# Patient Record
Sex: Male | Born: 1975 | Race: White | Hispanic: No | Marital: Single | Smoking: Never smoker
Health system: Southern US, Community
[De-identification: ages and names within clinical notes are randomized; demographics above are authoritative.]

## PROBLEM LIST (undated history)

## (undated) DIAGNOSIS — F32A Depression, unspecified: Secondary | ICD-10-CM

## (undated) DIAGNOSIS — J439 Emphysema, unspecified: Secondary | ICD-10-CM

## (undated) DIAGNOSIS — K219 Gastro-esophageal reflux disease without esophagitis: Secondary | ICD-10-CM

## (undated) DIAGNOSIS — I10 Essential (primary) hypertension: Secondary | ICD-10-CM

## (undated) DIAGNOSIS — J45909 Unspecified asthma, uncomplicated: Secondary | ICD-10-CM

## (undated) DIAGNOSIS — T7840XA Allergy, unspecified, initial encounter: Secondary | ICD-10-CM

## (undated) DIAGNOSIS — F419 Anxiety disorder, unspecified: Secondary | ICD-10-CM

## (undated) HISTORY — DX: Allergy, unspecified, initial encounter: T78.40XA

## (undated) HISTORY — DX: Anxiety disorder, unspecified: F41.9

## (undated) HISTORY — PX: NO PAST SURGERIES: SHX2092

## (undated) HISTORY — DX: Emphysema, unspecified: J43.9

## (undated) HISTORY — DX: Unspecified asthma, uncomplicated: J45.909

## (undated) HISTORY — DX: Depression, unspecified: F32.A

## (undated) HISTORY — DX: Gastro-esophageal reflux disease without esophagitis: K21.9

## (undated) HISTORY — DX: Essential (primary) hypertension: I10

---

## 2008-05-25 ENCOUNTER — Emergency Department: Payer: Self-pay | Admitting: Emergency Medicine

## 2008-06-12 ENCOUNTER — Emergency Department: Payer: Self-pay | Admitting: Emergency Medicine

## 2008-08-03 ENCOUNTER — Emergency Department: Payer: Self-pay | Admitting: Emergency Medicine

## 2011-07-17 ENCOUNTER — Ambulatory Visit: Payer: Self-pay

## 2011-07-17 LAB — BASIC METABOLIC PANEL
BUN: 13 mg/dL (ref 7–18)
Calcium, Total: 8.8 mg/dL (ref 8.5–10.1)
Chloride: 101 mmol/L (ref 98–107)
Co2: 34 mmol/L — ABNORMAL HIGH (ref 21–32)
Creatinine: 1.23 mg/dL (ref 0.60–1.30)
EGFR (Non-African Amer.): >60
Glucose: 103 mg/dL — ABNORMAL HIGH (ref 65–99)
Osmolality: 280 (ref 275–301)
Potassium: 3.6 mmol/L (ref 3.5–5.1)
Sodium: 140 mmol/L (ref 136–145)

## 2011-07-17 LAB — URINALYSIS, COMPLETE
Bilirubin,UR: NEGATIVE
Blood: NEGATIVE
Leukocyte Esterase: NEGATIVE
Ph: 6 (ref 4.5–8.0)
RBC,UR: NONE SEEN /HPF (ref 0–5)
Squamous Epithelial: NONE SEEN

## 2011-07-17 LAB — CBC WITH DIFFERENTIAL/PLATELET
Eosinophil #: 0.1 10*3/uL (ref 0.0–0.7)
HGB: 14.1 g/dL (ref 13.0–18.0)
Lymphocyte #: 1.4 10*3/uL (ref 1.0–3.6)
Lymphocyte %: 20.6 %
MCH: 27.5 pg (ref 26.0–34.0)
Monocyte #: 0.6 10*3/uL (ref 0.0–0.7)
Monocyte %: 9.4 %
Neutrophil %: 67.2 %
RBC: 5.12 10*6/uL (ref 4.40–5.90)
WBC: 6.7 10*3/uL (ref 3.8–10.6)

## 2011-10-11 ENCOUNTER — Ambulatory Visit: Payer: Self-pay | Admitting: Internal Medicine

## 2012-07-10 ENCOUNTER — Ambulatory Visit: Payer: Self-pay | Admitting: Family Medicine

## 2013-02-17 ENCOUNTER — Emergency Department: Payer: Self-pay | Admitting: Emergency Medicine

## 2013-02-28 ENCOUNTER — Emergency Department: Payer: Self-pay | Admitting: Emergency Medicine

## 2013-07-24 ENCOUNTER — Emergency Department: Payer: Self-pay | Admitting: Emergency Medicine

## 2013-08-06 ENCOUNTER — Ambulatory Visit: Payer: Self-pay | Admitting: Emergency Medicine

## 2014-10-31 ENCOUNTER — Emergency Department (HOSPITAL_COMMUNITY)
Admission: EM | Admit: 2014-10-31 | Discharge: 2014-10-31 | Disposition: A | Discharge status: Home / Self Care | Payer: 59 | Attending: Emergency Medicine | Admitting: Emergency Medicine

## 2014-10-31 ENCOUNTER — Encounter (HOSPITAL_COMMUNITY): Payer: Self-pay | Admitting: Emergency Medicine

## 2014-10-31 DIAGNOSIS — L237 Allergic contact dermatitis due to plants, except food: Secondary | ICD-10-CM | POA: Insufficient documentation

## 2014-10-31 DIAGNOSIS — R21 Rash and other nonspecific skin eruption: Secondary | ICD-10-CM | POA: Diagnosis present

## 2014-10-31 MED ORDER — DIPHENHYDRAMINE-ZINC ACETATE 1-0.1 % EX CREA: TOPICAL_CREAM | Freq: Three times a day (TID) | CUTANEOUS | Status: DC | PRN

## 2014-10-31 MED ORDER — METHYLPREDNISOLONE SODIUM SUCC 125 MG IJ SOLR
125.0000 mg | Freq: Once | INTRAMUSCULAR | Status: AC
  Administered 2014-10-31: 125 mg via INTRAMUSCULAR
  Filled 2014-10-31: qty 2

## 2014-10-31 MED ORDER — PREDNISONE 10 MG PO TABS: 20.0000 mg | ORAL_TABLET | Freq: Every day | ORAL | Status: DC

## 2014-10-31 NOTE — Discharge Instructions (Signed)

## 2014-10-31 NOTE — ED Notes (Signed)
Pt presents to the ED C/O rash secondary to poison ivy. Pt stated he was cutting bushes 2 days ago and noticed a rash that appeared yesterday. Rash is present on face, neck and arms. Pt states it is "all over"

## 2014-10-31 NOTE — ED Provider Notes (Signed)
CSN: [id number]     Arrival date & time 10/31/14  1158 History   This chart was scribed for non-physician practitioner working with Jack Sou, MD, by Jack Davis, ED Scribe. This patient was seen in room WTR8/WTR8 and the patient's care was started at 12:28 PM.    Chief Complaint  Patient presents with  . Rash    Poison Ivy    The history is provided by the patient. No language interpreter was used.    HPI Comments: Jack Davis is a 39 y.o. male who presents to the Emergency Department complaining of a red, itchy, gradually spreading, generalized rash. Pt states it is worse to his arms, face and legs. He notes the rash has begun to spread to his pubic region after touching down there before washing his hands. Pt states he was outside doing yard work 2 days ago and believes he was may have been exposed to poison ivy because he saw it in his yard and was trying to bundle it up after cutting it down. He took Zyrtec for the symptoms with no relief. He denies any tongue swelling, lip swelling, fever, or SOB.   History reviewed. No pertinent past medical history. History reviewed. No pertinent past surgical history. No family history on file. History  Substance Use Topics  . Smoking status: Never Smoker   . Smokeless tobacco: Not on file  . Alcohol Use: No    Review of Systems  Constitutional: Negative for fever.  HENT: Negative for facial swelling and trouble swallowing.   Respiratory: Negative for shortness of breath.   Skin: Positive for rash.  All other systems reviewed and are negative.     Allergies  Review of patient's allergies indicates no known allergies.  Home Medications   Prior to Admission medications   Medication Sig Start Date End Date Taking? Authorizing Provider  diphenhydrAMINE-zinc acetate (BENADRYL) cream Apply topically 3 (three) times daily as needed for itching. 10/31/14   Jack Pel, PA-C  predniSONE (DELTASONE) 10 MG tablet Take 2 tablets  (20 mg total) by mouth daily. 10/31/14   Jack Pel, PA-C   Triage Vitals: BP 145/79 mmHg  Pulse 62  Temp(Src) 98.2 F (36.8 C) (Oral)  Resp 16  Ht  (1.778 m)  Wt 180 lb (81.647 kg)  BMI 25.83 kg/m2  SpO2 97%  Physical Exam  Constitutional: He is oriented to person, place, and time. He appears well-developed and well-nourished. No distress.  HENT:  Head: Normocephalic and atraumatic.  Right Ear: Tympanic membrane and ear canal normal.  Left Ear: Tympanic membrane normal.  Nose: Nose normal.  Mouth/Throat: Uvula is midline, oropharynx is clear and moist and mucous membranes are normal.  Eyes: Conjunctivae and EOM are normal.  Neck: Neck supple. No tracheal deviation present.  Cardiovascular: Normal rate.   Pulmonary/Chest: Effort normal and breath sounds normal. No respiratory distress.  Musculoskeletal: Normal range of motion.  Neurological: He is alert and oriented to person, place, and time.  Skin: Skin is warm and dry.  Diffuse excoriated rash to genitals, face, bilateral arms. Spares chest, back and legs  Psychiatric: He has a normal mood and affect. His behavior is normal.  Nursing note and vitals reviewed.   ED Course  Procedures (including critical care time)  DIAGNOSTIC STUDIES: Oxygen Saturation is 97% on RA, normal by my interpretation.    COORDINATION OF CARE: 12:29 PM Advised pt that I will prescribe steroid injection. Advised him to return and seek medical attention if  he develops tongue swelling, trouble swallowing, SOB or fever.  Pt advised of plan for treatment Pt verbalizes understanding and agreement with plan.   Labs Review Labs Reviewed - No data to display  Imaging Review No results found.   EKG Interpretation None      MDM   Final diagnoses:  Poison ivy    Medications  methylPREDNISolone sodium succinate (SOLU-MEDROL) 125 mg/2 mL injection 125 mg (125 mg Intramuscular Given 10/31/14 1235)    No systemic or airway  involvement. Normal vitals. Pt is in no distress.  Given steroid shot and an rx for prednisone, advised to take benadryl PO or topical Benadryl. Advised to continue taking Zyrtec.   39 y.o.Jack Davis's evaluation in the Emergency Department is complete. It has been determined that no acute conditions requiring further emergency intervention are present at this time. The patient/guardian have been advised of the diagnosis and plan. We have discussed signs and symptoms that warrant return to the ED, such as changes or worsening in symptoms.  Vital signs are stable at discharge. Filed Vitals:   10/31/14 1202  BP: 145/79  Pulse: 62  Temp: 98.2 F (36.8 C)  Resp: 16    Patient/guardian has voiced understanding and agreed to follow-up with the PCP or specialist.     Jack Peliffany Kristle Wesch, PA-C 10/31/14 1251  Jack SouSam Jacubowitz, MD 10/31/14 [phone]

## 2014-12-01 ENCOUNTER — Ambulatory Visit: Payer: 59 | Admitting: Psychology

## 2014-12-04 ENCOUNTER — Ambulatory Visit: Payer: 59 | Admitting: Psychology

## 2014-12-24 ENCOUNTER — Ambulatory Visit: Payer: 59 | Admitting: Psychology

## 2014-12-26 ENCOUNTER — Ambulatory Visit (INDEPENDENT_AMBULATORY_CARE_PROVIDER_SITE_OTHER): Payer: 59 | Admitting: Psychology

## 2014-12-26 DIAGNOSIS — F411 Generalized anxiety disorder: Secondary | ICD-10-CM | POA: Diagnosis not present

## 2015-01-09 ENCOUNTER — Ambulatory Visit: Payer: 59 | Admitting: Psychology

## 2015-01-23 ENCOUNTER — Ambulatory Visit: Payer: 59 | Admitting: Psychology

## 2015-02-11 ENCOUNTER — Ambulatory Visit: Payer: 59 | Admitting: Psychology

## 2015-02-23 ENCOUNTER — Ambulatory Visit: Payer: 59 | Admitting: Psychology

## 2015-03-06 ENCOUNTER — Ambulatory Visit (INDEPENDENT_AMBULATORY_CARE_PROVIDER_SITE_OTHER): Payer: 59 | Admitting: Psychology

## 2015-03-06 DIAGNOSIS — F411 Generalized anxiety disorder: Secondary | ICD-10-CM | POA: Diagnosis not present

## 2015-03-27 ENCOUNTER — Ambulatory Visit: Payer: 59 | Admitting: Psychology

## 2015-07-04 ENCOUNTER — Emergency Department (HOSPITAL_COMMUNITY): Payer: 59

## 2015-07-04 ENCOUNTER — Emergency Department (HOSPITAL_COMMUNITY)
Admission: EM | Admit: 2015-07-04 | Discharge: 2015-07-04 | Disposition: A | Discharge status: Home / Self Care | Payer: 59 | Attending: Emergency Medicine | Admitting: Emergency Medicine

## 2015-07-04 ENCOUNTER — Encounter (HOSPITAL_COMMUNITY): Payer: Self-pay

## 2015-07-04 DIAGNOSIS — K5900 Constipation, unspecified: Secondary | ICD-10-CM | POA: Insufficient documentation

## 2015-07-04 DIAGNOSIS — R1031 Right lower quadrant pain: Secondary | ICD-10-CM | POA: Diagnosis present

## 2015-07-04 DIAGNOSIS — Z7982 Long term (current) use of aspirin: Secondary | ICD-10-CM | POA: Insufficient documentation

## 2015-07-04 DIAGNOSIS — N200 Calculus of kidney: Secondary | ICD-10-CM | POA: Diagnosis not present

## 2015-07-04 LAB — COMPREHENSIVE METABOLIC PANEL
ALT: 20 U/L (ref 17–63)
ANION GAP: 11 (ref 5–15)
AST: 25 U/L (ref 15–41)
Albumin: 4.9 g/dL (ref 3.5–5.0)
Alkaline Phosphatase: 63 U/L (ref 38–126)
BUN: 18 mg/dL (ref 6–20)
CHLORIDE: 107 mmol/L (ref 101–111)
CO2: 25 mmol/L (ref 22–32)
CREATININE: 1.32 mg/dL — AB (ref 0.61–1.24)
Calcium: 9.6 mg/dL (ref 8.9–10.3)
Glucose, Bld: 157 mg/dL — ABNORMAL HIGH (ref 65–99)
Potassium: 3.4 mmol/L — ABNORMAL LOW (ref 3.5–5.1)
Sodium: 143 mmol/L (ref 135–145)
Total Bilirubin: 0.4 mg/dL (ref 0.3–1.2)
Total Protein: 7.8 g/dL (ref 6.5–8.1)

## 2015-07-04 LAB — URINALYSIS, ROUTINE W REFLEX MICROSCOPIC
Bilirubin Urine: NEGATIVE
GLUCOSE, UA: NEGATIVE mg/dL
KETONES UR: NEGATIVE mg/dL
LEUKOCYTES UA: NEGATIVE
NITRITE: NEGATIVE
PH: 6 (ref 5.0–8.0)
Protein, ur: NEGATIVE mg/dL

## 2015-07-04 LAB — CBC
HCT: 40.3 % (ref 39.0–52.0)
Hemoglobin: 12.9 g/dL — ABNORMAL LOW (ref 13.0–17.0)
MCH: 27.7 pg (ref 26.0–34.0)
MCHC: 32 g/dL (ref 30.0–36.0)
MCV: 86.5 fL (ref 78.0–100.0)
Platelets: 224 10*3/uL (ref 150–400)
RBC: 4.66 MIL/uL (ref 4.22–5.81)
RDW: 13.7 % (ref 11.5–15.5)
WBC: 12.9 10*3/uL — AB (ref 4.0–10.5)

## 2015-07-04 LAB — URINE MICROSCOPIC-ADD ON
BACTERIA UA: NONE SEEN
Squamous Epithelial / LPF: NONE SEEN

## 2015-07-04 LAB — LIPASE, BLOOD: LIPASE: 23 U/L (ref 11–51)

## 2015-07-04 MED ORDER — IOHEXOL 300 MG/ML  SOLN
100.0000 mL | Freq: Once | INTRAMUSCULAR | Status: AC | PRN
  Administered 2015-07-04: 100 mL via INTRAVENOUS

## 2015-07-04 MED ORDER — ONDANSETRON 4 MG PO TBDP: ORAL_TABLET | ORAL | Status: DC

## 2015-07-04 MED ORDER — IOHEXOL 300 MG/ML  SOLN
50.0000 mL | Freq: Once | INTRAMUSCULAR | Status: AC | PRN
  Administered 2015-07-04: 50 mL via ORAL

## 2015-07-04 MED ORDER — SODIUM CHLORIDE 0.9 % IV BOLUS (SEPSIS)
1000.0000 mL | Freq: Once | INTRAVENOUS | Status: AC
  Administered 2015-07-04: 1000 mL via INTRAVENOUS

## 2015-07-04 MED ORDER — ONDANSETRON 4 MG PO TBDP
4.0000 mg | ORAL_TABLET | Freq: Once | ORAL | Status: AC | PRN
  Administered 2015-07-04: 4 mg via ORAL
  Filled 2015-07-04: qty 1

## 2015-07-04 MED ORDER — MORPHINE SULFATE (PF) 4 MG/ML IV SOLN
4.0000 mg | Freq: Once | INTRAVENOUS | Status: AC
  Administered 2015-07-04: 4 mg via INTRAVENOUS
  Filled 2015-07-04: qty 1

## 2015-07-04 NOTE — Discharge Instructions (Signed)
Follow-up with urology. Tried to catch a stone in a strainer. Urology may want to analyze it. Take 4 over the counter ibuprofen tablets 3 times a day or 2 over-the-counter naproxen tablets twice a day for pain.  Kidney Stones Kidney stones (urolithiasis) are deposits that form inside your kidneys. The intense pain is caused by the stone moving through the urinary tract. When the stone moves, the ureter goes into spasm around the stone. The stone is usually passed in the urine.  CAUSES   A disorder that makes certain neck glands produce too much parathyroid hormone (primary hyperparathyroidism).  A buildup of uric acid crystals, similar to gout in your joints.  Narrowing (stricture) of the ureter.  A kidney obstruction present at birth (congenital obstruction).  Previous surgery on the kidney or ureters.  Numerous kidney infections. SYMPTOMS   Feeling sick to your stomach (nauseous).  Throwing up (vomiting).  Blood in the urine (hematuria).  Pain that usually spreads (radiates) to the groin.  Frequency or urgency of urination. DIAGNOSIS   Taking a history and physical exam.  Blood or urine tests.  CT scan.  Occasionally, an examination of the inside of the urinary bladder (cystoscopy) is performed. TREATMENT   Observation.  Increasing your fluid intake.  Extracorporeal shock wave lithotripsy--This is a noninvasive procedure that uses shock waves to break up kidney stones.  Surgery may be needed if you have severe pain or persistent obstruction. There are various surgical procedures. Most of the procedures are performed with the use of small instruments. Only small incisions are needed to accommodate these instruments, so recovery time is minimized. The size, location, and chemical composition are all important variables that will determine the proper choice of action for you. Talk to your health care provider to better understand your situation so that you will minimize  the risk of injury to yourself and your kidney.  HOME CARE INSTRUCTIONS   Drink enough water and fluids to keep your urine clear or pale yellow. This will help you to pass the stone or stone fragments.  Strain all urine through the provided strainer. Keep all particulate matter and stones for your health care provider to see. The stone causing the pain may be as small as a grain of salt. It is very important to use the strainer each and every time you pass your urine. The collection of your stone will allow your health care provider to analyze it and verify that a stone has actually passed. The stone analysis will often identify what you can do to reduce the incidence of recurrences.  Only take over-the-counter or prescription medicines for pain, discomfort, or fever as directed by your health care provider.  Keep all follow-up visits as told by your health care provider. This is important.  Get follow-up X-rays if required. The absence of pain does not always mean that the stone has passed. It may have only stopped moving. If the urine remains completely obstructed, it can cause loss of kidney function or even complete destruction of the kidney. It is your responsibility to make sure X-rays and follow-ups are completed. Ultrasounds of the kidney can show blockages and the status of the kidney. Ultrasounds are not associated with any radiation and can be performed easily in a matter of minutes.  Make changes to your daily diet as told by your health care provider. You may be told to:  Limit the amount of salt that you eat.  Eat 5 or more servings of fruits  and vegetables each day.  Limit the amount of meat, poultry, fish, and eggs that you eat.  Collect a 24-hour urine sample as told by your health care provider.You may need to collect another urine sample every 6-12 months. SEEK MEDICAL CARE IF:  You experience pain that is progressive and unresponsive to any pain medicine you have been  prescribed. SEEK IMMEDIATE MEDICAL CARE IF:   Pain cannot be controlled with the prescribed medicine.  You have a fever or shaking chills.  The severity or intensity of pain increases over 18 hours and is not relieved by pain medicine.  You develop a new onset of abdominal pain.  You feel faint or pass out.  You are unable to urinate.   This information is not intended to replace advice given to you by your health care provider. Make sure you discuss any questions you have with your health care provider.   Document Released: 05/23/2005 Document Revised: 02/11/2015 Document Reviewed: 10/24/2012 Elsevier Interactive Patient Education [employer]

## 2015-07-04 NOTE — ED Provider Notes (Signed)
CSN: [id number]     Arrival date & time 07/04/15  0559 History   First MD Initiated Contact with Patient 07/04/15 0701     Chief Complaint  Patient presents with  . Abdominal Pain     (Consider location/radiation/quality/duration/timing/severity/associated sxs/prior Treatment) Patient is a 40 y.o. male presenting with abdominal pain. The history is provided by the patient.  Abdominal Pain Pain location:  RLQ Pain quality: sharp and shooting   Pain radiates to:  Does not radiate Pain severity:  Moderate Onset quality:  Sudden Duration:  2 hours Timing:  Constant Progression:  Unchanged Chronicity:  New Relieved by:  Nothing Worsened by:  Nothing tried Ineffective treatments:  None tried Associated symptoms: constipation and nausea   Associated symptoms: no chest pain, no chills, no diarrhea, no fever, no shortness of breath and no vomiting     40 yo M with a chief complaint of right-sided abdominal pain. This started just about an hour ago. Patient is sharp shooting feels like a cramp in his right side. Patient also thought that it felt like he needed to have a bowel movement but is unable to go this morning. Patient having some nausea but denies vomiting. Denies radiation to his testicles or spleen is. His pain is colicky. Denies prior medical history.  History reviewed. No pertinent past medical history. History reviewed. No pertinent past surgical history. Family History  Problem Relation Age of Onset  . Kidney Stones Mother   . Kidney Stones Father    Social History  Substance Use Topics  . Smoking status: Never Smoker   . Smokeless tobacco: Never Used  . Alcohol Use: Yes    Review of Systems  Constitutional: Negative for fever and chills.  HENT: Negative for congestion and facial swelling.   Eyes: Negative for discharge and visual disturbance.  Respiratory: Negative for shortness of breath.   Cardiovascular: Negative for chest pain and palpitations.   Gastrointestinal: Positive for nausea, abdominal pain and constipation. Negative for vomiting and diarrhea.  Musculoskeletal: Negative for myalgias and arthralgias.  Skin: Negative for color change and rash.  Neurological: Negative for tremors, syncope and headaches.  Psychiatric/Behavioral: Negative for confusion and dysphoric mood.      Allergies  Review of patient's allergies indicates no known allergies.  Home Medications   Prior to Admission medications   Medication Sig Start Date End Date Taking? Authorizing Provider  aspirin EC 325 MG tablet Take 650 mg by mouth once.   Yes Historical Provider, MD  diphenhydrAMINE-zinc acetate (BENADRYL) cream Apply topically 3 (three) times daily as needed for itching. Patient not taking: Reported on 07/04/2015 10/31/14   Marlon Pel, PA-C  ondansetron (ZOFRAN ODT) 4 MG disintegrating tablet  ODT q4 hours prn nausea/vomit 07/04/15   Melene Plan, DO  predniSONE (DELTASONE) 10 MG tablet Take 2 tablets (20 mg total) by mouth daily. Patient not taking: Reported on 07/04/2015 10/31/14   Marlon Pel, PA-C   BP 105/50 mmHg  Pulse 67  Temp(Src) 98.2 F (36.8 C) (Oral)  Resp 18  SpO2 100% Physical Exam  Constitutional: He is oriented to person, place, and time. He appears well-developed and well-nourished.  HENT:  Head: Normocephalic and atraumatic.  Eyes: EOM are normal. Pupils are equal, round, and reactive to light.  Neck: Normal range of motion. Neck supple. No JVD present.  Cardiovascular: Normal rate and regular rhythm.  Exam reveals no gallop and no friction rub.   No murmur heard. Pulmonary/Chest: No respiratory distress. He has no wheezes.  Abdominal: He exhibits no distension. There is tenderness (mild RLQ). There is no rebound and no guarding.  Musculoskeletal: Normal range of motion.  Neurological: He is alert and oriented to person, place, and time.  Skin: No rash noted. No pallor.  Psychiatric: He has a normal mood and  affect. His behavior is normal.  Nursing note and vitals reviewed.   ED Course  Procedures (including critical care time) Labs Review Labs Reviewed  COMPREHENSIVE METABOLIC PANEL - Abnormal; Notable for the following:    Potassium 3.4 (*)    Glucose, Bld 157 (*)    Creatinine, Ser 1.32 (*)    All other components within normal limits  CBC - Abnormal; Notable for the following:    WBC 12.9 (*)    Hemoglobin 12.9 (*)    All other components within normal limits  URINALYSIS, ROUTINE W REFLEX MICROSCOPIC (NOT AT [facility]) - Abnormal; Notable for the following:    Specific Gravity, Urine >1.046 (*)    Hgb urine dipstick LARGE (*)    All other components within normal limits  LIPASE, BLOOD  URINE MICROSCOPIC-ADD ON    Imaging Review Ct Abdomen Pelvis W Contrast  07/04/2015  CLINICAL DATA:  Right lower quadrant pain EXAM: CT ABDOMEN AND PELVIS WITH CONTRAST TECHNIQUE: Multidetector CT imaging of the abdomen and pelvis was performed using the standard protocol following bolus administration of intravenous contrast. CONTRAST:  50mL OMNIPAQUE IOHEXOL 300 MG/ML SOLN, OMNIPAQUE IOHEXOL 300 MG/ML SOLN COMPARISON:  None. FINDINGS: Lung bases are free of acute infiltrate or sizable effusion. The liver, spleen, gallbladder, adrenal glands and pancreas are all normal in their CT appearance. Kidneys are well visualized bilaterally. Slight delay in enhancement is noted within the right kidney with very minimal fullness of the collecting system and ureter. No definitive right ureteral stone is noted. A tiny calcification is noted in the bladder posteriorly which may represent a recently passed stone. The appendix is within normal limits. The bladder is will otherwise well distended. No pelvic mass lesion or sidewall abnormality is noted. No bony abnormality is noted. IMPRESSION: Tiny 1-2 mm stone within the bladder consistent with a recently passed stone. Mild fullness is noted on the right likely related  to edema from the recently passed stone. Electronically Signed   By: Alcide Clever M.D.   On: 07/04/2015 08:24   I have personally reviewed and evaluated these images and lab results as part of my medical decision-making.   EKG Interpretation None      MDM   Final diagnoses:  Nephrolithiasis    39 yo M with a chief complaint of right-sided abdominal pain. Patient with mild right lower quadrant pain. Will obtain a CT scan with contrast to evaluate for appendicitis.  CT scan negative for appendicitis patient has a small stone in his bladder which likely was represents a past kidney stone.. Patient's symptoms significantly improved. UA negative for infection.  Discharge home follow-up with urology given a strainer.  10:42 AM:  I have discussed the diagnosis/risks/treatment options with the patient and family and believe the pt to be eligible for discharge home to follow-up with PCP. We also discussed returning to the ED immediately if new or worsening sx occur. We discussed the sx which are most concerning (e.g., sudden worsening pain, fever, inability to tolerate by mouth) that necessitate immediate return. Medications administered to the patient during their visit and any new prescriptions provided to the patient are listed below.  Medications given during this visit Medications  ondansetron (ZOFRAN-ODT) disintegrating tablet 4 mg (4 mg Oral Given 07/04/15 0724)  sodium chloride 0.9 % bolus 1,000 mL (0 mLs Intravenous Stopped 07/04/15 0840)  morphine 4 MG/ML injection 4 mg (4 mg Intravenous Given 07/04/15 0724)  iohexol (OMNIPAQUE) 300 MG/ML solution 50 mL (50 mLs Oral Contrast Given 07/04/15 0737)  iohexol (OMNIPAQUE) 300 MG/ML solution 100 mL (100 mLs Intravenous Contrast Given 07/04/15 0809)    New Prescriptions   ONDANSETRON (ZOFRAN ODT) 4 MG DISINTEGRATING TABLET     ODT q4 hours prn nausea/vomit    The patient appears reasonably screen and/or stabilized for discharge and I doubt  any other medical condition or other EMC requiring further screening, evaluation, or treatment in the ED at this time prior to discharge.    Melene Plan, DO 07/04/15 1042

## 2015-07-04 NOTE — ED Notes (Signed)
Pt c/o RLQ Abd Pain that radiates into his groin. Pt denies n/v and diarrhea. Pt denies hx of kidney stones. Pt denies hx of abd surgeries. Pt reports he has his appendix.

## 2015-07-04 NOTE — ED Notes (Signed)
Jack Davis/significant other  [phone]

## 2015-07-04 NOTE — ED Notes (Signed)
Pt. Is unable to urinate at this time.  

## 2017-01-19 IMAGING — CT CT ABD-PELV W/ CM
2 of 4 series · 16 of 46 positions shown, 18 images · IV contrast (OMNIPAQUE 300)
Comparison: None.

CLINICAL DATA: Right lower quadrant pain

EXAM:
CT ABDOMEN AND PELVIS WITH CONTRAST
TECHNIQUE: Multidetector CT imaging of the abdomen and pelvis was performed
using the standard protocol following bolus administration of
intravenous contrast.
CONTRAST:  50mL OMNIPAQUE IOHEXOL 300 MG/ML SOLN, 100mL OMNIPAQUE
IOHEXOL 300 MG/ML SOLN

[Series 2: abd/pel with · axial · 0.74mm/px · z∈[-489,-44]mm · 13 of 97 slices shown, 15 images]
[im 4/97  soft-tissue]
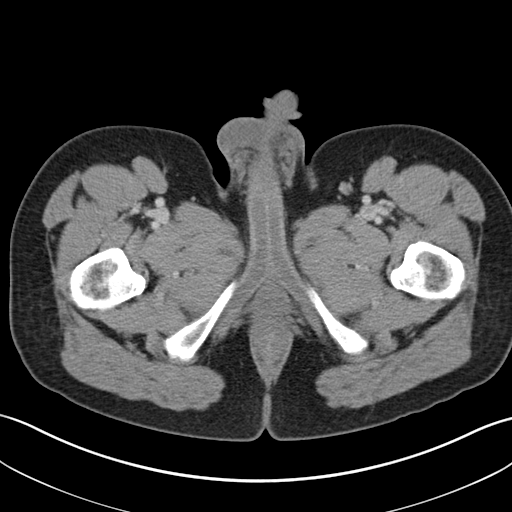
[im 4/97  bone]
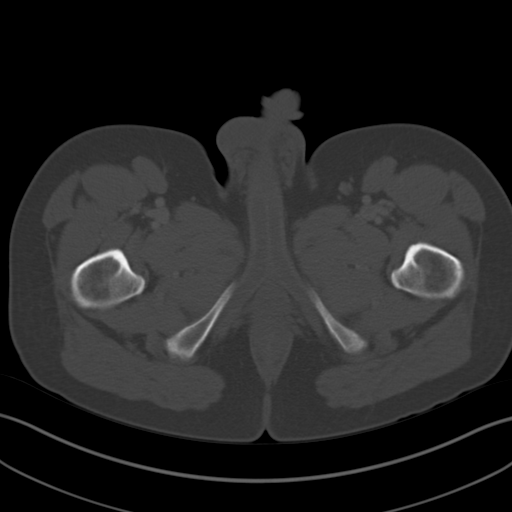
[im 12/97  soft-tissue]
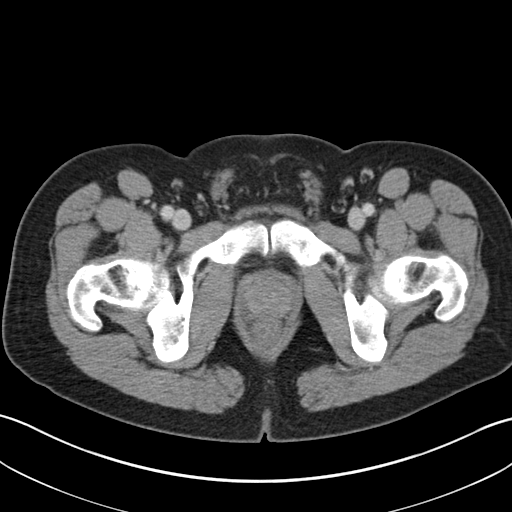
[im 20/97  soft-tissue]
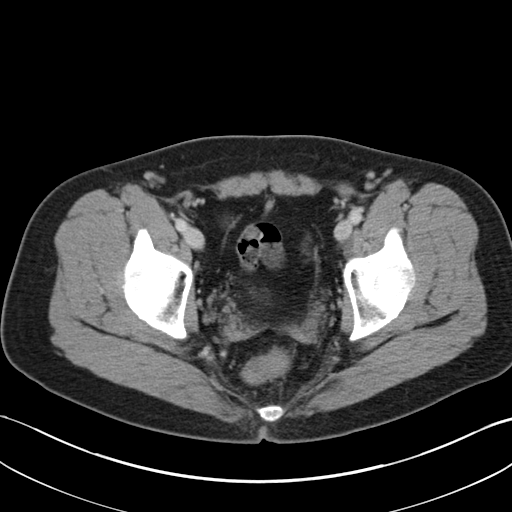
[im 27/97  soft-tissue]
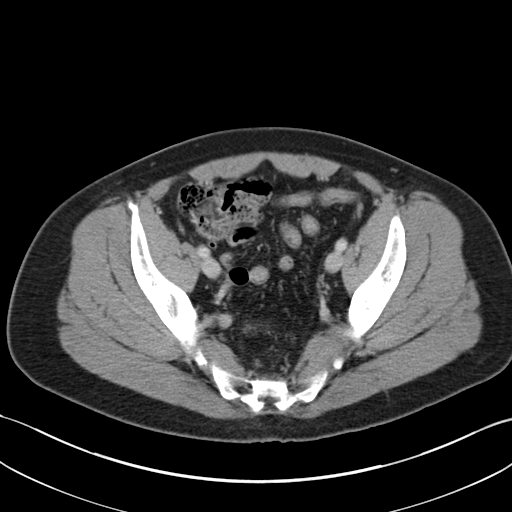
[im 35/97  soft-tissue]
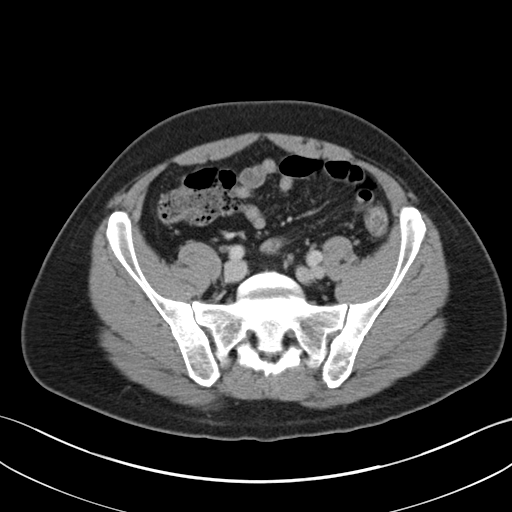
[im 43/97  soft-tissue]
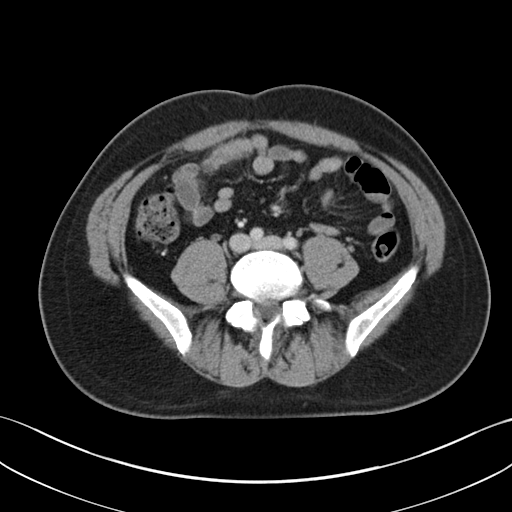
[im 50/97  soft-tissue]
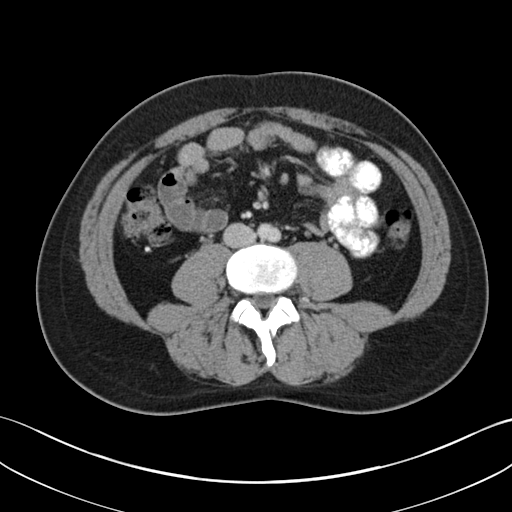
[im 54/97  soft-tissue]
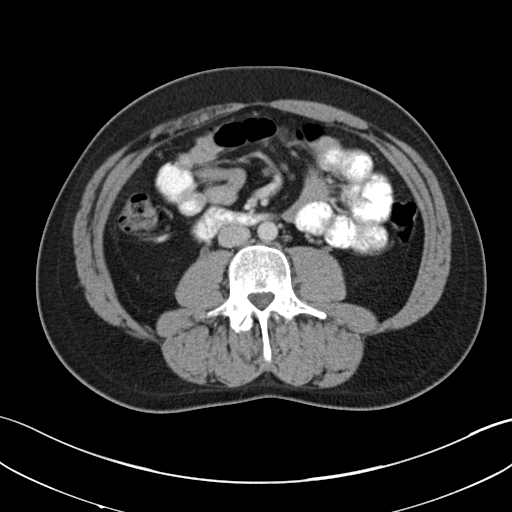
[im 62/97  soft-tissue]
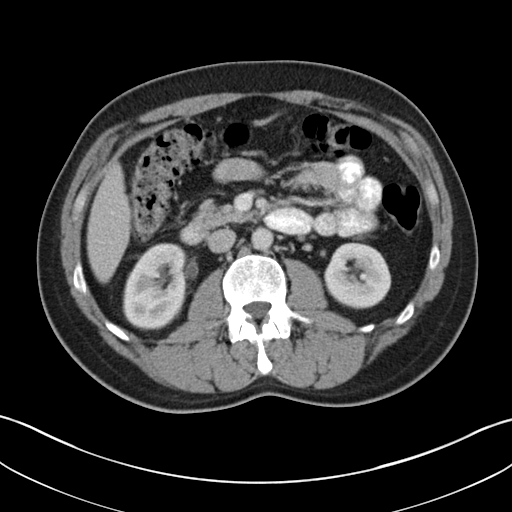
[im 62/97  bone]
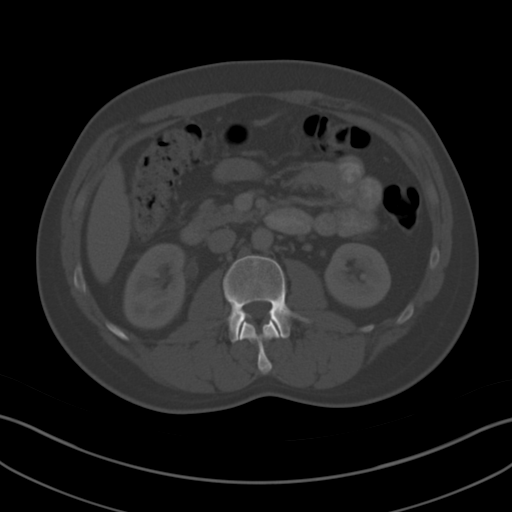
[im 70/97  soft-tissue]
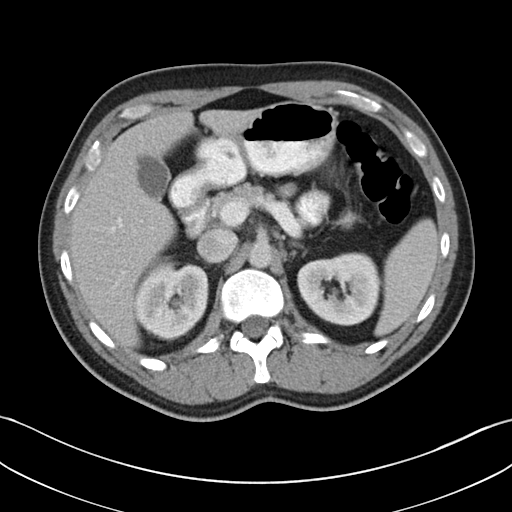
[im 77/97  soft-tissue]
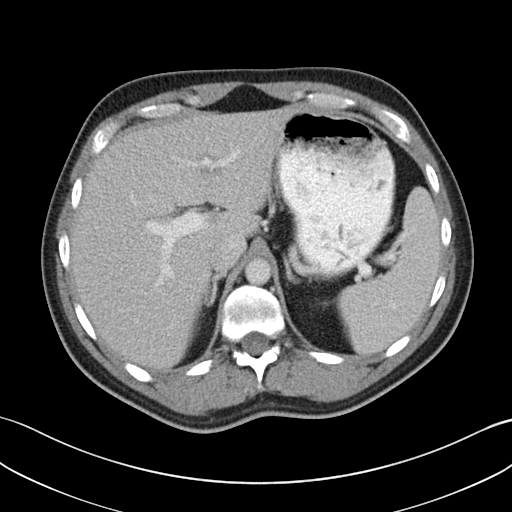
[im 85/97  soft-tissue]
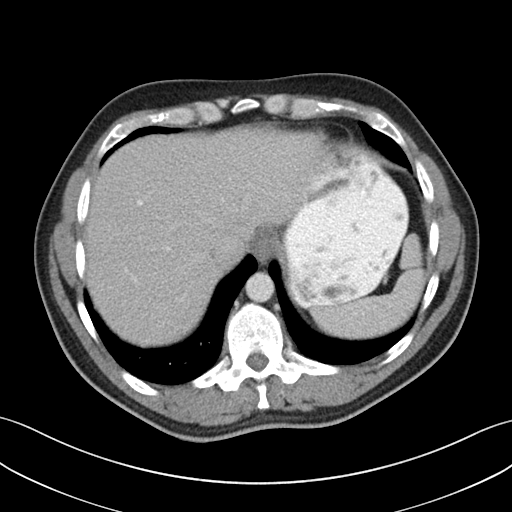
[im 93/97  soft-tissue]
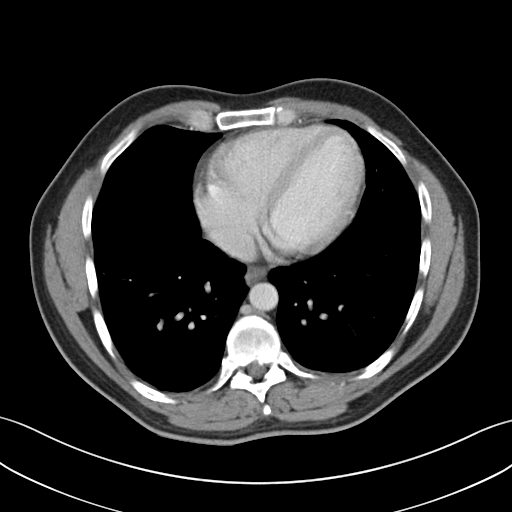

[Series 5: coronal a/|p · coronal · 0.74mm/px · 3 of 89 slices shown]
[im 30/89  soft-tissue]
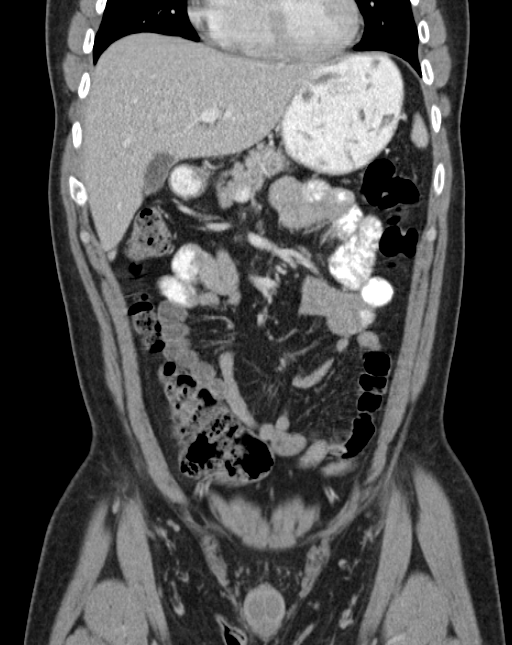
[im 40/89  soft-tissue]
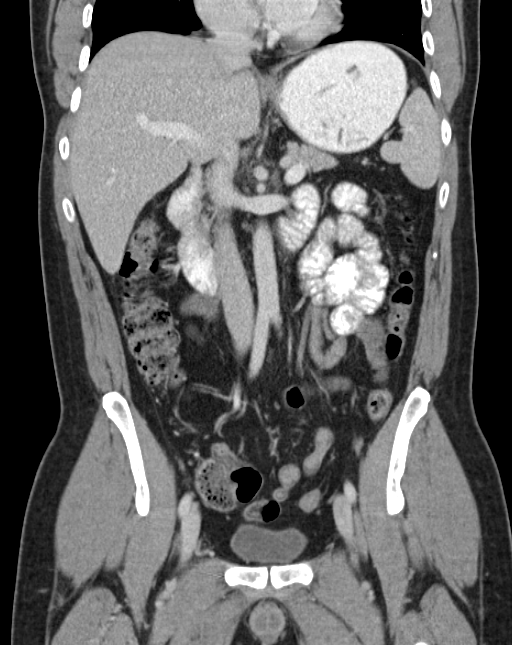
[im 49/89  soft-tissue]
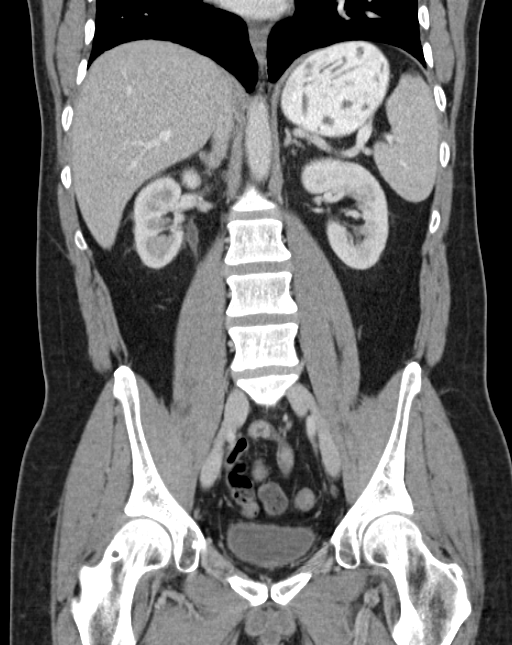

[16 of 46 positions shown; findings below may reference images not displayed]

FINDINGS: Lung bases are free of acute infiltrate or sizable effusion.

The liver, spleen, gallbladder, adrenal glands and pancreas are all
normal in their CT appearance. Kidneys are well visualized
bilaterally. Slight delay in enhancement is noted within the right
kidney with very minimal fullness of the collecting system and
ureter. No definitive right ureteral stone is noted. A tiny
calcification is noted in the bladder posteriorly which may
represent a recently passed stone.

The appendix is within normal limits. The bladder is will otherwise
well distended. No pelvic mass lesion or sidewall abnormality is
noted. No bony abnormality is noted.
IMPRESSION: Tiny 1-2 mm stone within the bladder consistent with a recently
passed stone. Mild fullness is noted on the right likely related to
edema from the recently passed stone.

## 2018-01-24 ENCOUNTER — Emergency Department (HOSPITAL_COMMUNITY): Payer: Self-pay

## 2018-01-24 ENCOUNTER — Emergency Department (HOSPITAL_COMMUNITY)
Admission: EM | Admit: 2018-01-24 | Discharge: 2018-01-24 | Disposition: A | Discharge status: Home / Self Care | Payer: Self-pay | Attending: Emergency Medicine | Admitting: Emergency Medicine

## 2018-01-24 ENCOUNTER — Encounter (HOSPITAL_COMMUNITY): Payer: Self-pay | Admitting: Emergency Medicine

## 2018-01-24 DIAGNOSIS — Y929 Unspecified place or not applicable: Secondary | ICD-10-CM | POA: Insufficient documentation

## 2018-01-24 DIAGNOSIS — S62312A Displaced fracture of base of third metacarpal bone, right hand, initial encounter for closed fracture: Secondary | ICD-10-CM | POA: Insufficient documentation

## 2018-01-24 DIAGNOSIS — Y998 Other external cause status: Secondary | ICD-10-CM | POA: Insufficient documentation

## 2018-01-24 DIAGNOSIS — Y9389 Activity, other specified: Secondary | ICD-10-CM | POA: Insufficient documentation

## 2018-01-24 DIAGNOSIS — W230XXA Caught, crushed, jammed, or pinched between moving objects, initial encounter: Secondary | ICD-10-CM | POA: Insufficient documentation

## 2018-01-24 MED ORDER — IBUPROFEN 800 MG PO TABS: 800.0000 mg | ORAL_TABLET | Freq: Three times a day (TID) | ORAL | 0 refills | Status: DC | PRN

## 2018-01-24 NOTE — ED Provider Notes (Signed)
Emergency Department Provider Note   I have reviewed the triage vital signs and the nursing notes.   HISTORY  Chief Complaint Hand Injury   HPI Jack Davis is a 42 y.o. male right-hand-dominant presents the emergency department for evaluation of right hand pain and swelling.  He was working on his lawnmower yesterday when it fell and landed on his right hand.  He has had pain at the base of the middle finger with some swelling and bruising since that time.  No numbness.  Pain is worse with movement.  No radiation of symptoms or other modifying factors.  Is been taking over-the-counter pain medication but when bruising seem to worsen today he presented to the emergency department.  History reviewed. No pertinent past medical history.  There are no active problems to display for this patient.   History reviewed. No pertinent surgical history.  Allergies Patient has no known allergies.  Family History  Problem Relation Age of Onset  . Kidney Stones Mother   . Kidney Stones Father     Social History Social History   Tobacco Use  . Smoking status: Never Smoker  . Smokeless tobacco: Never Used  Substance Use Topics  . Alcohol use: Yes  . Drug use: No    Review of Systems  Constitutional: No fever/chills Eyes: No visual changes. ENT: No sore throat. Cardiovascular: Denies chest pain. Respiratory: Denies shortness of breath. Gastrointestinal: No abdominal pain.  No nausea, no vomiting.  No diarrhea.  No constipation. Genitourinary: Negative for dysuria. Musculoskeletal: Negative for back pain. Positive right hand pain.  Skin: Negative for rash. Neurological: Negative for headaches, focal weakness or numbness.  10-point ROS otherwise negative.  ____________________________________________   PHYSICAL EXAM:  VITAL SIGNS: ED Triage Vitals  Enc Vitals Group     BP 01/24/18 1011 124/79     Pulse Rate 01/24/18 1011 77     Resp 01/24/18 1011 18     Temp  01/24/18 1011 98.2 F (36.8 C)     Temp src --      SpO2 01/24/18 1011 99 %     Weight 01/24/18 1011 180 lb (81.6 kg)     Height 01/24/18 1011 5\' 9"  (1.753 m)     Pain Score 01/24/18 1012 9   Constitutional: Alert and oriented. Well appearing and in no acute distress. Eyes: Conjunctivae are normal.  Head: Atraumatic. Nose: No congestion/rhinnorhea. Mouth/Throat: Mucous membranes are moist.  Neck: No stridor.  Cardiovascular: Good peripheral circulation.  Respiratory: Normal respiratory effort.  Gastrointestinal: No distention.  Musculoskeletal: Bruising and mild swelling and the base of the 3rd metacarpal. No laceration or concern for open fracture. No wrist tenderness to palpation. Normal ROM of the wrist and elbow.  Neurologic:  Normal speech and language. No gross focal neurologic deficits are appreciated.  Skin:  Skin is warm, dry and intact. No rash noted.   ____________________________________________  RADIOLOGY  Dg Hand Complete Right  Result Date: 01/24/2018 CLINICAL DATA:  Dropped lawnmower on hand last night with third and fourth finger injury. EXAM: RIGHT HAND - COMPLETE 3+ VIEW COMPARISON:  None. FINDINGS: There is a mildly displaced comminuted fracture involving the base of the third proximal phalanx. Minimal degenerate change over the radiocarpal joint and carpal bones. Remainder of the exam is unremarkable. IMPRESSION: Minimally displaced and slightly comminuted fracture involving the base of the third proximal phalanx. Electronically Signed   By: Elberta Fortisaniel  Boyle M.D.   On: 01/24/2018 10:29    ____________________________________________  PROCEDURES  Procedure(s) performed:   Procedures  None ____________________________________________   INITIAL IMPRESSION / ASSESSMENT AND PLAN / ED COURSE  Pertinent labs & imaging results that were available during my care of the patient were reviewed by me and considered in my medical decision making (see chart for  details).  Patient presents to the emergency department for evaluation of right hand pain.  Plain film reviewed which shows mildly displaced fracture at the base of the third metacarpal.  No concern for open fracture.  No dislocation.  Plan for volar splint placement and follow-up with hand surgery in the coming week.   At this time, I do not feel there is any life-threatening condition present. I have reviewed and discussed all results (EKG, imaging, lab, urine as appropriate), exam findings with patient. I have reviewed nursing notes and appropriate previous records.  I feel the patient is safe to be discharged home without further emergent workup. Discussed usual and customary return precautions. Patient and family (if present) verbalize understanding and are comfortable with this plan.  Patient will follow-up with their primary care provider. If they do not have a primary care provider, information for follow-up has been provided to them. All questions have been answered.  ____________________________________________  FINAL CLINICAL IMPRESSION(S) / ED DIAGNOSES  Final diagnoses:  Closed displaced fracture of base of third metacarpal bone of right hand, initial encounter    NEW OUTPATIENT MEDICATIONS STARTED DURING THIS VISIT:  New Prescriptions   IBUPROFEN (ADVIL,MOTRIN) 800 MG TABLET    Take 1 tablet (800 mg total) by mouth every 8 (eight) hours as needed.    Note:  This document was prepared using Dragon voice recognition software and may include unintentional dictation errors.  Alona BeneJoshua Anjannette Gauger, MD Emergency Medicine    Littie Chiem, Arlyss RepressJoshua G, MD 01/24/18 1054

## 2018-01-24 NOTE — ED Triage Notes (Signed)
Pt report working on riding lawnmower last night when fell on right hand. Pt has bruising and pain to right hand.

## 2018-01-24 NOTE — ED Notes (Signed)
Discharge instructions reviewed with patient, no questions

## 2018-01-24 NOTE — Discharge Instructions (Signed)
You were seen in the ED today with a finger fracture. You will need to keep the splint clean and dry. You will need to follow up with the hand surgeon listed by phone today and schedule a follow up appointment in the next week. Use Tylenol and/or Motrin for pain. Return to the ED with any new or worsening symptoms.

## 2018-01-24 NOTE — ED Notes (Signed)
Pt has been given an ice pack at this time. 

## 2019-12-16 ENCOUNTER — Encounter: Payer: Self-pay | Admitting: Emergency Medicine

## 2019-12-16 ENCOUNTER — Ambulatory Visit
Admission: EM | Admit: 2019-12-16 | Discharge: 2019-12-16 | Disposition: A | Discharge status: Home / Self Care | Payer: 59 | Attending: Family Medicine | Admitting: Family Medicine

## 2019-12-16 ENCOUNTER — Other Ambulatory Visit: Payer: Self-pay

## 2019-12-16 DIAGNOSIS — H9209 Otalgia, unspecified ear: Secondary | ICD-10-CM | POA: Insufficient documentation

## 2019-12-16 DIAGNOSIS — J069 Acute upper respiratory infection, unspecified: Secondary | ICD-10-CM | POA: Insufficient documentation

## 2019-12-16 DIAGNOSIS — R05 Cough: Secondary | ICD-10-CM | POA: Diagnosis present

## 2019-12-16 DIAGNOSIS — U071 COVID-19: Secondary | ICD-10-CM | POA: Diagnosis not present

## 2019-12-16 DIAGNOSIS — Z79899 Other long term (current) drug therapy: Secondary | ICD-10-CM | POA: Insufficient documentation

## 2019-12-16 NOTE — ED Triage Notes (Addendum)
Patient in today c/o nasal congestion, cough, ear pain x 4 days. Patient denies fever. Patient has not taken any OTC medications. Patient has not had the covid vaccines and is requesting a covid test today.

## 2019-12-16 NOTE — ED Provider Notes (Signed)
[facility]    CSN: [id number] Arrival date & time: 12/16/19  1324      History   Chief Complaint Chief Complaint  Patient presents with  . Nasal Congestion  . Cough  . Otalgia    HPI Jack Davis is a 44 y.o. male.   44 yo male with a c/o nasal congestion, runny nose, cough and ear pressure. Denies any fevers, chills, chest pains, shortness of breath. States parents with sick last week with a viral illness. Patient has not had the covid vaccines.    Cough Associated symptoms: ear pain   Otalgia Associated symptoms: cough     History reviewed. No pertinent past medical history.  There are no problems to display for this patient.   Past Surgical History:  Procedure Laterality Date  . NO PAST SURGERIES         Home Medications    Prior to Admission medications   Medication Sig Start Date End Date Taking? Authorizing Provider  diphenhydrAMINE-zinc acetate (BENADRYL) cream Apply topically 3 (three) times daily as needed for itching. Patient not taking: Reported on 07/04/2015 10/31/14   Marlon Pel, PA-C  ibuprofen (ADVIL,MOTRIN) 800 MG tablet Take 1 tablet (800 mg total) by mouth every 8 (eight) hours as needed. 01/24/18   Long, Arlyss Repress, MD  Multiple Vitamin (MULTIVITAMIN WITH MINERALS) TABS tablet Take 1 tablet by mouth daily.    [provider]  ondansetron (ZOFRAN ODT) 4 MG disintegrating tablet 4mg  ODT q4 hours prn nausea/vomit Patient not taking: Reported on 01/24/2018 07/04/15   07/06/15, DO  predniSONE (DELTASONE) 10 MG tablet Take 2 tablets (20 mg total) by mouth daily. Patient not taking: Reported on 07/04/2015 10/31/14   11/02/14, PA-C    Family History Family History  Problem Relation Age of Onset  . Kidney Stones Mother   . Lung cancer Mother   . Schizophrenia Mother   . Depression Mother   . Anemia Mother   . Kidney Stones Father   . Diabetes Father   . Heart disease Father     Social History Social History     Tobacco Use  . Smoking status: Never Smoker  . Smokeless tobacco: Never Used  Vaping Use  . Vaping Use: Never used  Substance Use Topics  . Alcohol use: Yes    Comment: rare  . Drug use: No     Allergies   Patient has no known allergies.   Review of Systems Review of Systems  HENT: Positive for ear pain.   Respiratory: Positive for cough.      Physical Exam Triage Vital Signs ED Triage Vitals  Enc Vitals Group     BP 12/16/19 1341 125/81     Pulse Rate 12/16/19 1341 71     Resp 12/16/19 1341 18     Temp 12/16/19 1341 98.3 F (36.8 C)     Temp Source 12/16/19 1341 Oral     SpO2 12/16/19 1341 99 %     Weight 12/16/19 1342 200 lb (90.7 kg)     Height 12/16/19 1342 5' 9.5" (1.765 m)     Head Circumference --      Peak Flow --      Pain Score 12/16/19 1342 4     Pain Loc --      Pain Edu? --      Excl. in GC? --    No data found.  Updated Vital Signs BP 125/81 (BP Location: Left Arm)  Pulse 71   Temp 98.3 F (36.8 C) (Oral)   Resp 18   Ht 5' 9.5" (1.765 m)   Wt 90.7 kg   SpO2 99%   BMI 29.11 kg/m   Visual Acuity Right Eye Distance:   Left Eye Distance:   Bilateral Distance:    Right Eye Near:   Left Eye Near:    Bilateral Near:     Physical Exam Vitals and nursing note reviewed.  Constitutional:      General: He is not in acute distress.    Appearance: He is not toxic-appearing or diaphoretic.  HENT:     Right Ear: Tympanic membrane normal.     Left Ear: Tympanic membrane normal.     Nose: Congestion and rhinorrhea present.     Mouth/Throat:     Pharynx: No oropharyngeal exudate or posterior oropharyngeal erythema.  Cardiovascular:     Rate and Rhythm: Normal rate.     Heart sounds: Normal heart sounds.  Pulmonary:     Effort: Pulmonary effort is normal. No respiratory distress.     Breath sounds: Normal breath sounds. No stridor. No wheezing, rhonchi or rales.  Musculoskeletal:     Cervical back: Neck supple.  Neurological:      Mental Status: He is alert.      UC Treatments / Results  Labs (all labs ordered are listed, but only abnormal results are displayed) Labs Reviewed  SARS CORONAVIRUS 2 (TAT 6-24 HRS)    EKG   Radiology No results found.  Procedures Procedures (including critical care time)  Medications Ordered in UC Medications - No data to display  Initial Impression / Assessment and Plan / UC Course  I have reviewed the triage vital signs and the nursing notes.  Pertinent labs & imaging results that were available during my care of the patient were reviewed by me and considered in my medical decision making (see chart for details).      Final Clinical Impressions(s) / UC Diagnoses   Final diagnoses:  Viral URI with cough    ED Prescriptions    None      1. diagnosis reviewed with patient 2. Recommend supportive treatment with rest, fluids, otc meds  3. Await covid test 4. Follow-up prn if symptoms worsen or don't improve   PDMP not reviewed this encounter.   Payton Mccallum, MD 12/16/19 1430

## 2019-12-17 LAB — SARS CORONAVIRUS 2 (TAT 6-24 HRS): SARS Coronavirus 2: POSITIVE — AB

## 2019-12-18 ENCOUNTER — Other Ambulatory Visit: Payer: Self-pay | Admitting: Physician Assistant

## 2019-12-18 DIAGNOSIS — Z6829 Body mass index (BMI) 29.0-29.9, adult: Secondary | ICD-10-CM

## 2019-12-18 DIAGNOSIS — U071 COVID-19: Secondary | ICD-10-CM

## 2019-12-18 MED ORDER — SODIUM CHLORIDE 0.9 % IV SOLN
Freq: Once | INTRAVENOUS | Status: AC
  Filled 2019-12-18: qty 600

## 2019-12-18 NOTE — Progress Notes (Signed)
I connected by phone with Jack Davis on 12/18/2019 at 11:52 AM to discuss the potential use of a new treatment for mild to moderate COVID-19 viral infection in non-hospitalized patients.  This patient is a 45 y.o. male that meets the FDA criteria for Emergency Use Authorization of COVID monoclonal antibody casirivimab/imdevimab.  Has a (+) direct SARS-CoV-2 viral test result  Has mild or moderate COVID-19   Is NOT hospitalized due to COVID-19  Is within 10 days of symptom onset  Has at least one of the high risk factor(s) for progression to severe COVID-19 and/or hospitalization as defined in EUA.  Specific high risk criteria : BMI > 25   I have spoken and communicated the following to the patient or parent/caregiver regarding COVID monoclonal antibody treatment:  1. FDA has authorized the emergency use for the treatment of mild to moderate COVID-19 in adults and pediatric patients with positive results of direct SARS-CoV-2 viral testing who are 17 years of age and older weighing at least 40 kg, and who are at high risk for progressing to severe COVID-19 and/or hospitalization.  2. The significant known and potential risks and benefits of COVID monoclonal antibody, and the extent to which such potential risks and benefits are unknown.  3. Information on available alternative treatments and the risks and benefits of those alternatives, including clinical trials.  4. Patients treated with COVID monoclonal antibody should continue to self-isolate and use infection control measures (e.g., wear mask, isolate, social distance, avoid sharing personal items, clean and disinfect "high touch" surfaces, and frequent handwashing) according to CDC guidelines.   5. The patient or parent/caregiver has the option to accept or refuse COVID monoclonal antibody treatment.  After reviewing this information with the patient, The patient agreed to proceed with receiving casirivimab\imdevimab infusion and  will be provided a copy of the Fact sheet prior to receiving the infusion.   Sx onset 7/8. Set up for infusion tomorrow 7/15 @ 10:30am. Directions given to new [facility].   Cline Crock 12/18/2019 11:52 AM

## 2019-12-19 ENCOUNTER — Ambulatory Visit (HOSPITAL_COMMUNITY)
Admission: RE | Admit: 2019-12-19 | Discharge: 2019-12-19 | Disposition: A | Discharge status: Home / Self Care | Payer: 59 | Source: Ambulatory Visit | Attending: Pulmonary Disease | Admitting: Pulmonary Disease

## 2019-12-19 DIAGNOSIS — Z6829 Body mass index (BMI) 29.0-29.9, adult: Secondary | ICD-10-CM

## 2019-12-19 DIAGNOSIS — U071 COVID-19: Secondary | ICD-10-CM

## 2019-12-19 MED ORDER — DIPHENHYDRAMINE HCL 50 MG/ML IJ SOLN: 50.0000 mg | Freq: Once | INTRAMUSCULAR | Status: DC | PRN

## 2019-12-19 MED ORDER — SODIUM CHLORIDE 0.9 % IV SOLN: INTRAVENOUS | Status: DC | PRN

## 2019-12-19 MED ORDER — METHYLPREDNISOLONE SODIUM SUCC 125 MG IJ SOLR: 125.0000 mg | Freq: Once | INTRAMUSCULAR | Status: DC | PRN

## 2019-12-19 MED ORDER — EPINEPHRINE 0.3 MG/0.3ML IJ SOAJ: 0.3000 mg | Freq: Once | INTRAMUSCULAR | Status: DC | PRN

## 2019-12-19 MED ORDER — FAMOTIDINE IN NACL 20-0.9 MG/50ML-% IV SOLN: 20.0000 mg | Freq: Once | INTRAVENOUS | Status: DC | PRN

## 2019-12-19 MED ORDER — ALBUTEROL SULFATE HFA 108 (90 BASE) MCG/ACT IN AERS: 2.0000 | INHALATION_SPRAY | Freq: Once | RESPIRATORY_TRACT | Status: DC | PRN

## 2019-12-19 NOTE — Progress Notes (Signed)
  Diagnosis: COVID-19  Physician: Dr. Patrick Wright  Procedure: Covid Infusion Clinic Med: casirivimab\imdevimab infusion - Provided patient with casirivimab\imdevimab fact sheet for patients, parents and caregivers prior to infusion.  Complications: No immediate complications noted.  Discharge: Discharged home   Jack Davis 12/19/2019  

## 2019-12-19 NOTE — Discharge Instructions (Signed)

## 2020-05-23 ENCOUNTER — Encounter: Payer: Self-pay | Admitting: Emergency Medicine

## 2020-05-23 ENCOUNTER — Emergency Department: Payer: 59

## 2020-05-23 ENCOUNTER — Other Ambulatory Visit: Payer: Self-pay

## 2020-05-23 ENCOUNTER — Emergency Department
Admission: EM | Admit: 2020-05-23 | Discharge: 2020-05-23 | Disposition: A | Discharge status: Home / Self Care | Payer: 59 | Attending: Emergency Medicine | Admitting: Emergency Medicine

## 2020-05-23 DIAGNOSIS — Y92410 Unspecified street and highway as the place of occurrence of the external cause: Secondary | ICD-10-CM | POA: Insufficient documentation

## 2020-05-23 DIAGNOSIS — R519 Headache, unspecified: Secondary | ICD-10-CM | POA: Diagnosis present

## 2020-05-23 DIAGNOSIS — R918 Other nonspecific abnormal finding of lung field: Secondary | ICD-10-CM

## 2020-05-23 DIAGNOSIS — R911 Solitary pulmonary nodule: Secondary | ICD-10-CM | POA: Insufficient documentation

## 2020-05-23 LAB — CBC
HCT: 38.3 % — ABNORMAL LOW (ref 39.0–52.0)
Hemoglobin: 12.7 g/dL — ABNORMAL LOW (ref 13.0–17.0)
MCH: 28.2 pg (ref 26.0–34.0)
MCHC: 33.2 g/dL (ref 30.0–36.0)
MCV: 85.1 fL (ref 80.0–100.0)
Platelets: 219 10*3/uL (ref 150–400)
RBC: 4.5 MIL/uL (ref 4.22–5.81)
RDW: 13.3 % (ref 11.5–15.5)
WBC: 6.7 10*3/uL (ref 4.0–10.5)
nRBC: 0 % (ref 0.0–0.2)

## 2020-05-23 LAB — BASIC METABOLIC PANEL
Anion gap: 7 (ref 5–15)
BUN: 11 mg/dL (ref 6–20)
CO2: 27 mmol/L (ref 22–32)
Calcium: 9 mg/dL (ref 8.9–10.3)
Chloride: 103 mmol/L (ref 98–111)
Creatinine, Ser: 0.98 mg/dL (ref 0.61–1.24)
GFR, Estimated: >60 mL/min (ref >60)
Glucose, Bld: 98 mg/dL (ref 70–99)
Potassium: 3.5 mmol/L (ref 3.5–5.1)
Sodium: 137 mmol/L (ref 135–145)

## 2020-05-23 MED ORDER — CYCLOBENZAPRINE HCL 5 MG PO TABS
ORAL_TABLET | ORAL | 0 refills | Status: DC
Start: 2020-05-23 — End: 2023-03-27

## 2020-05-23 MED ORDER — IOHEXOL 300 MG/ML  SOLN
100.0000 mL | Freq: Once | INTRAMUSCULAR | Status: AC | PRN
  Administered 2020-05-23: 100 mL via INTRAVENOUS
  Filled 2020-05-23: qty 100

## 2020-05-23 MED ORDER — IBUPROFEN 600 MG PO TABS
600.0000 mg | ORAL_TABLET | Freq: Four times a day (QID) | ORAL | 0 refills | Status: DC | PRN
Start: 2020-05-23 — End: 2023-03-27
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
## 2022-10-18 ENCOUNTER — Emergency Department: Payer: No Typology Code available for payment source

## 2022-10-18 ENCOUNTER — Emergency Department
Admission: EM | Admit: 2022-10-18 | Discharge: 2022-10-18 | Disposition: A | Discharge status: Home / Self Care | Payer: 59 | Attending: Emergency Medicine | Admitting: Emergency Medicine

## 2022-10-18 ENCOUNTER — Encounter: Payer: Self-pay | Admitting: Emergency Medicine

## 2022-10-18 DIAGNOSIS — S6992XA Unspecified injury of left wrist, hand and finger(s), initial encounter: Secondary | ICD-10-CM | POA: Diagnosis present

## 2022-10-18 DIAGNOSIS — Y92410 Unspecified street and highway as the place of occurrence of the external cause: Secondary | ICD-10-CM | POA: Insufficient documentation

## 2022-10-18 DIAGNOSIS — S0990XA Unspecified injury of head, initial encounter: Secondary | ICD-10-CM | POA: Diagnosis not present

## 2022-10-18 DIAGNOSIS — S66912A Strain of unspecified muscle, fascia and tendon at wrist and hand level, left hand, initial encounter: Secondary | ICD-10-CM | POA: Insufficient documentation

## 2022-10-18 DIAGNOSIS — T148XXA Other injury of unspecified body region, initial encounter: Secondary | ICD-10-CM
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
## 2022-10-18 NOTE — Discharge Instructions (Addendum)

## 2022-12-02 ENCOUNTER — Ambulatory Visit: Payer: Self-pay | Admitting: Physician Assistant

## 2023-01-23 ENCOUNTER — Ambulatory Visit: Payer: Self-pay | Admitting: Family Medicine

## 2023-03-07 ENCOUNTER — Encounter: Payer: Self-pay | Admitting: Family Medicine

## 2023-03-07 ENCOUNTER — Ambulatory Visit (INDEPENDENT_AMBULATORY_CARE_PROVIDER_SITE_OTHER): Payer: 59 | Admitting: Family Medicine

## 2023-03-07 VITALS — BP 118/86 | HR 68 | Temp 98.8°F | Ht 65.0 in | Wt 200.0 lb

## 2023-03-07 DIAGNOSIS — E010 Iodine-deficiency related diffuse (endemic) goiter: Secondary | ICD-10-CM

## 2023-03-07 DIAGNOSIS — J342 Deviated nasal septum: Secondary | ICD-10-CM

## 2023-03-07 DIAGNOSIS — Z87442 Personal history of urinary calculi: Secondary | ICD-10-CM | POA: Insufficient documentation

## 2023-03-07 DIAGNOSIS — Z0001 Encounter for general adult medical examination with abnormal findings: Secondary | ICD-10-CM

## 2023-03-07 DIAGNOSIS — Z6833 Body mass index (BMI) 33.0-33.9, adult: Secondary | ICD-10-CM | POA: Insufficient documentation

## 2023-03-07 DIAGNOSIS — J432 Centrilobular emphysema: Secondary | ICD-10-CM

## 2023-03-07 DIAGNOSIS — Z833 Family history of diabetes mellitus: Secondary | ICD-10-CM | POA: Diagnosis not present

## 2023-03-07 DIAGNOSIS — R0683 Snoring: Secondary | ICD-10-CM

## 2023-03-07 DIAGNOSIS — K111 Hypertrophy of salivary gland: Secondary | ICD-10-CM

## 2023-03-07 DIAGNOSIS — Z1159 Encounter for screening for other viral diseases: Secondary | ICD-10-CM

## 2023-03-07 DIAGNOSIS — Z0000 Encounter for general adult medical examination without abnormal findings: Secondary | ICD-10-CM

## 2023-03-07 DIAGNOSIS — Z114 Encounter for screening for human immunodeficiency virus [HIV]: Secondary | ICD-10-CM

## 2023-03-07 DIAGNOSIS — Z1211 Encounter for screening for malignant neoplasm of colon: Secondary | ICD-10-CM

## 2023-03-07 DIAGNOSIS — B351 Tinea unguium: Secondary | ICD-10-CM | POA: Diagnosis not present

## 2023-03-07 DIAGNOSIS — J3089 Other allergic rhinitis: Secondary | ICD-10-CM

## 2023-03-07 DIAGNOSIS — J4520 Mild intermittent asthma, uncomplicated: Secondary | ICD-10-CM

## 2023-03-07 DIAGNOSIS — J302 Other seasonal allergic rhinitis: Secondary | ICD-10-CM

## 2023-03-07 DIAGNOSIS — Z13220 Encounter for screening for lipoid disorders: Secondary | ICD-10-CM

## 2023-03-07 DIAGNOSIS — R0681 Apnea, not elsewhere classified: Secondary | ICD-10-CM

## 2023-03-07 MED ORDER — CICLOPIROX 8 % EX SOLN: Freq: Every day | CUTANEOUS | 0 refills | Status: DC

## 2023-03-07 MED ORDER — CETIRIZINE HCL 10 MG PO TABS: 10.0000 mg | ORAL_TABLET | Freq: Every day | ORAL | 11 refills | Status: AC

## 2023-03-07 MED ORDER — ALBUTEROL SULFATE HFA 108 (90 BASE) MCG/ACT IN AERS: 2.0000 | INHALATION_SPRAY | Freq: Four times a day (QID) | RESPIRATORY_TRACT | 2 refills | Status: AC | PRN
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
## 2023-03-08 ENCOUNTER — Other Ambulatory Visit: Payer: Self-pay | Admitting: Family Medicine

## 2023-03-08 ENCOUNTER — Telehealth: Payer: Self-pay

## 2023-03-08 ENCOUNTER — Other Ambulatory Visit: Payer: Self-pay

## 2023-03-08 ENCOUNTER — Encounter: Payer: Self-pay | Admitting: Family Medicine

## 2023-03-08 DIAGNOSIS — J4520 Mild intermittent asthma, uncomplicated: Secondary | ICD-10-CM

## 2023-03-08 DIAGNOSIS — J432 Centrilobular emphysema: Secondary | ICD-10-CM

## 2023-03-08 DIAGNOSIS — Z1211 Encounter for screening for malignant neoplasm of colon: Secondary | ICD-10-CM

## 2023-03-08 MED ORDER — NA SULFATE-K SULFATE-MG SULF 17.5-3.13-1.6 GM/177ML PO SOLN
1.0000 | Freq: Once | ORAL | 0 refills | Status: AC
Start: 2023-03-08 — End: 2023-03-08

## 2023-03-08 NOTE — Telephone Encounter (Signed)
Gastroenterology Pre-Procedure Review  Request Date: 03/23/23 Requesting Physician: Dr. Tobi Bastos  PATIENT REVIEW QUESTIONS: The patient responded to the following health history questions as indicated:    1. Are you having any GI issues? no 2. Do you have a personal history of Polyps? no 3. Do you have a family history of Colon Cancer or Polyps? no 4. Diabetes Mellitus? no 5. Joint replacements in the past 12 months?no 6. Major health problems in the past 3 months?no 7. Any artificial heart valves, MVP, or defibrillator?no    MEDICATIONS & ALLERGIES:    Patient reports the following regarding taking any anticoagulation/antiplatelet therapy:   Plavix, Coumadin, Eliquis, Xarelto, Lovenox, Pradaxa, Brilinta, or Effient? no Aspirin? no  Patient confirms/reports the following medications:  Current Outpatient Medications  Medication Sig Dispense Refill   albuterol (VENTOLIN HFA) 108 (90 Base) MCG/ACT inhaler Inhale 2 puffs into the lungs every 6 (six) hours as needed for wheezing or shortness of breath. 8 g 2   cetirizine (ZYRTEC) 10 MG tablet Take 1 tablet (10 mg total) by mouth daily. 30 tablet 11   ciclopirox (PENLAC) 8 % solution Apply topically at bedtime. Apply over nail and surrounding skin. Apply daily over previous coat. After seven (7) days, may remove with alcohol and continue cycle. 6.6 mL 0   cyclobenzaprine (FLEXERIL) 5 MG tablet Take 1-2 tablets 3 times daily as needed 20 tablet 0   ibuprofen (ADVIL) 600 MG tablet Take 1 tablet (600 mg total) by mouth every 6 (six) hours as needed. 30 tablet 0   Multiple Vitamin (MULTIVITAMIN WITH MINERALS) TABS tablet Take 1 tablet by mouth daily.     No current facility-administered medications for this visit.    Patient confirms/reports the following allergies:  No Known Allergies  No orders of the defined types were placed in this encounter.   AUTHORIZATION INFORMATION Primary Insurance: 1D#: Group #:  Secondary  Insurance: 1D#: Group #:  SCHEDULE INFORMATION: Date: 03/23/23 Time: Location: [facility]

## 2023-03-16 ENCOUNTER — Ambulatory Visit: Payer: 59

## 2023-03-22 ENCOUNTER — Telehealth: Payer: Self-pay

## 2023-03-22 NOTE — Telephone Encounter (Signed)
Rosann Auerbach states that patient called back to find out why the ENDO unit was calling. She informed him to tell him the time of his colonoscopy tomorrow. He stated he is not going to have the procedure because he has to pay half of his colonoscopy per his insurance. He states that he was going to call our office to let us know

## 2023-03-23 ENCOUNTER — Ambulatory Visit: Admission: RE | Admit: 2023-03-23 | Payer: 59 | Source: Home / Self Care | Admitting: Gastroenterology

## 2023-03-23 SURGERY — COLONOSCOPY WITH PROPOFOL
Anesthesia: General

## 2023-03-27 ENCOUNTER — Ambulatory Visit: Payer: Self-pay | Admitting: Family Medicine

## 2023-03-27 ENCOUNTER — Encounter: Payer: Self-pay | Admitting: Family Medicine

## 2023-03-27 DIAGNOSIS — K111 Hypertrophy of salivary gland: Secondary | ICD-10-CM | POA: Insufficient documentation

## 2023-03-27 DIAGNOSIS — E010 Iodine-deficiency related diffuse (endemic) goiter: Secondary | ICD-10-CM | POA: Insufficient documentation

## 2023-03-27 NOTE — Assessment & Plan Note (Signed)
Bilateral.  Noted on exam.  Patient declines further evaluation at this time.

## 2023-03-27 NOTE — Assessment & Plan Note (Signed)
Will check an A1c today.

## 2023-03-27 NOTE — Assessment & Plan Note (Signed)
Counseled patient on diet and exercise. Will check an A1c, given positive family history.

## 2023-03-27 NOTE — Assessment & Plan Note (Signed)
Noted. No acute concerns.  Continue to monitor.

## 2023-03-27 NOTE — Assessment & Plan Note (Signed)
Per patient request, will refer him to ENT for evaluation of deviated septum and possible methods to address it.

## 2023-03-27 NOTE — Assessment & Plan Note (Signed)
Will start patient on albuterol as needed and check PFT

## 2023-03-27 NOTE — Assessment & Plan Note (Signed)
Noted on exam.  Will evaluate with thyroid ultrasound and TSH.

## 2023-03-27 NOTE — Assessment & Plan Note (Addendum)
Noted on CT.  Will evaluate with pulmonary function test. If increasingly symptomatic, will start ICS/LABA. Prescribed albuterol as needed

## 2023-03-27 NOTE — Assessment & Plan Note (Signed)
Physical exam overall unremarkable except as noted above. Routine lab work ordered as noted.

## 2023-04-05 ENCOUNTER — Institutional Professional Consult (permissible substitution): Payer: No Typology Code available for payment source | Admitting: Neurology

## 2023-04-20 ENCOUNTER — Ambulatory Visit: Payer: 59 | Attending: Family Medicine

## 2023-05-13 ENCOUNTER — Emergency Department
Admission: EM | Admit: 2023-05-13 | Discharge: 2023-05-13 | Disposition: A | Discharge status: Home / Self Care | Payer: 59 | Attending: Emergency Medicine | Admitting: Emergency Medicine

## 2023-05-13 ENCOUNTER — Emergency Department: Payer: 59

## 2023-05-13 ENCOUNTER — Encounter: Payer: Self-pay | Admitting: Emergency Medicine

## 2023-05-13 ENCOUNTER — Other Ambulatory Visit: Payer: Self-pay

## 2023-05-13 DIAGNOSIS — I10 Essential (primary) hypertension: Secondary | ICD-10-CM | POA: Insufficient documentation

## 2023-05-13 DIAGNOSIS — N23 Unspecified renal colic: Secondary | ICD-10-CM

## 2023-05-13 DIAGNOSIS — J4520 Mild intermittent asthma, uncomplicated: Secondary | ICD-10-CM | POA: Insufficient documentation

## 2023-05-13 DIAGNOSIS — R109 Unspecified abdominal pain: Secondary | ICD-10-CM | POA: Diagnosis present

## 2023-05-13 DIAGNOSIS — N201 Calculus of ureter: Secondary | ICD-10-CM | POA: Diagnosis not present

## 2023-05-13 LAB — URINALYSIS, ROUTINE W REFLEX MICROSCOPIC
Bacteria, UA: NONE SEEN
Bilirubin Urine: NEGATIVE
Glucose, UA: NEGATIVE mg/dL
Ketones, ur: NEGATIVE mg/dL
Leukocytes,Ua: NEGATIVE
Nitrite: NEGATIVE
Protein, ur: 30 mg/dL — AB
RBC / HPF: >50 RBC/hpf (ref 0–5)
Specific Gravity, Urine: 1.023 (ref 1.005–1.030)
pH: 5 (ref 5.0–8.0)

## 2023-05-13 LAB — COMPREHENSIVE METABOLIC PANEL
ALT: 17 U/L (ref 0–44)
AST: 16 U/L (ref 15–41)
Albumin: 4.3 g/dL (ref 3.5–5.0)
Alkaline Phosphatase: 64 U/L (ref 38–126)
Anion gap: 7 (ref 5–15)
BUN: 8 mg/dL (ref 6–20)
CO2: 27 mmol/L (ref 22–32)
Calcium: 8.8 mg/dL — ABNORMAL LOW (ref 8.9–10.3)
Chloride: 104 mmol/L (ref 98–111)
Creatinine, Ser: 1.07 mg/dL (ref 0.61–1.24)
GFR, Estimated: >60 mL/min (ref >60)
Glucose, Bld: 116 mg/dL — ABNORMAL HIGH (ref 70–99)
Potassium: 3.5 mmol/L (ref 3.5–5.1)
Sodium: 138 mmol/L (ref 135–145)
Total Bilirubin: 0.8 mg/dL (ref <1.2)
Total Protein: 7.4 g/dL (ref 6.5–8.1)

## 2023-05-13 LAB — CBC WITH DIFFERENTIAL/PLATELET
Abs Immature Granulocytes: 0.03 10*3/uL (ref 0.00–0.07)
Basophils Absolute: 0.1 10*3/uL (ref 0.0–0.1)
Basophils Relative: 1 %
Eosinophils Absolute: 0.1 10*3/uL (ref 0.0–0.5)
Eosinophils Relative: 2 %
HCT: 41.1 % (ref 39.0–52.0)
Hemoglobin: 13.5 g/dL (ref 13.0–17.0)
Immature Granulocytes: 0 %
Lymphocytes Relative: 20 %
Lymphs Abs: 1.7 10*3/uL (ref 0.7–4.0)
MCH: 28.3 pg (ref 26.0–34.0)
MCHC: 32.8 g/dL (ref 30.0–36.0)
MCV: 86.2 fL (ref 80.0–100.0)
Monocytes Absolute: 0.5 10*3/uL (ref 0.1–1.0)
Monocytes Relative: 5 %
Neutro Abs: 6.4 10*3/uL (ref 1.7–7.7)
Neutrophils Relative %: 72 %
Platelets: 251 10*3/uL (ref 150–400)
RBC: 4.77 MIL/uL (ref 4.22–5.81)
RDW: 13.1 % (ref 11.5–15.5)
WBC: 8.9 10*3/uL (ref 4.0–10.5)
nRBC: 0 % (ref 0.0–0.2)

## 2023-05-13 MED ORDER — ONDANSETRON 4 MG PO TBDP: 4.0000 mg | ORAL_TABLET | Freq: Three times a day (TID) | ORAL | 0 refills | Status: AC | PRN

## 2023-05-13 MED ORDER — OXYCODONE-ACETAMINOPHEN 5-325 MG PO TABS: 1.0000 | ORAL_TABLET | ORAL | 0 refills | Status: AC | PRN

## 2023-05-13 MED ORDER — TAMSULOSIN HCL 0.4 MG PO CAPS: 0.4000 mg | ORAL_CAPSULE | Freq: Every day | ORAL | 0 refills | Status: DC

## 2023-05-13 MED ORDER — KETOROLAC TROMETHAMINE 60 MG/2ML IM SOLN
30.0000 mg | Freq: Once | INTRAMUSCULAR | Status: AC
Start: 2023-05-13 — End: 2023-05-13
  Administered 2023-05-13: 30 mg via INTRAMUSCULAR
  Filled 2023-05-13: qty 2

## 2023-05-13 MED ORDER — TAMSULOSIN HCL 0.4 MG PO CAPS
0.4000 mg | ORAL_CAPSULE | Freq: Once | ORAL | Status: AC
  Administered 2023-05-13: 0.4 mg via ORAL
  Filled 2023-05-13: qty 1
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
## 2023-05-13 NOTE — ED Notes (Signed)
Patient verbalizes understanding of discharge instructions. Opportunity for questioning and answers were provided. Pt aware prescriptions are via paper script, provided at discharge.  Pt discharged from ED via POV with father.  Ambulatory, A&Ox4. Stayed 15 min past admin of toradol IM to monitor for signs of allergic reaction, none assessed at this time.

## 2023-05-13 NOTE — ED Triage Notes (Signed)
Patient c/o right flank pain x 3 days.  Patient has a history of kidney stones and states it feels similar.

## 2023-05-13 NOTE — Discharge Instructions (Signed)
1. Take pain & nausea medicines as needed (Percocet/Zofran #30). Make sure to take a stool softener while taking narcotic pain medicines. 2. Take Flomax 0.4mg daily x 14 days. 3. Drink plenty of bottled or filtered water daily. 4. Return to the ER for worsening symptoms, persistent vomiting, fever, difficulty breathing or other concerns.  

## 2023-05-17 ENCOUNTER — Other Ambulatory Visit: Payer: Self-pay

## 2023-05-17 ENCOUNTER — Ambulatory Visit (INDEPENDENT_AMBULATORY_CARE_PROVIDER_SITE_OTHER): Payer: 59 | Admitting: Urology

## 2023-05-17 VITALS — BP 146/81 | HR 56 | Ht 69.5 in | Wt 211.0 lb

## 2023-05-17 DIAGNOSIS — N201 Calculus of ureter: Secondary | ICD-10-CM

## 2023-05-17 DIAGNOSIS — N200 Calculus of kidney: Secondary | ICD-10-CM

## 2023-05-17 DIAGNOSIS — Z01818 Encounter for other preprocedural examination: Secondary | ICD-10-CM

## 2023-05-17 NOTE — Progress Notes (Signed)
    [facility] ESWL POSTING SHEET        Patient Name: Jack Davis  DOB: Oct 14, 1975  MRN: [id number]  Surgeon:  Legrand Rams, MD  Diagnosis:  Right Ureteral Stone  CPT: [phone]  ESWL DATE: 05/18/2023  ESWL TIME: 1230pm Arrival 10am  Special Needs/Requirements: no       Cardiac/Medical/Pulmonary Clearance needed: no       Form Faxed to [facility]- [phone] Date:   Date: 05/17/23       Form Faxed to [place]- [phone]  Date:  Date: 05/17/23           Copy Made for Insurance PA:  Date: 05/17/23       Orders Entered in to Epic:  Date: 05/17/23
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
## 2023-05-17 NOTE — Progress Notes (Signed)
ESWL ORDER FORM  Expected date of procedure: 05/18/23  Surgeon: Legrand Rams, MD  Post op standing: 2-4wk follow up w/KUB prior  Anticoagulation/Aspirin/NSAID standing order: Hold all 72 hours prior  Anesthesia standing order: MAC  VTE standing: SCD's  Dx: Right Ureteral Stone  Procedure: right Extracorporeal shock wave lithotripsy  CPT : 50590  Standing Order Set:   *NPO after mn, KUB  *NS 169ml/hr, Keflex 500mg  PO, Benadryl 25mg  PO, Valium 10mg  PO, Zofran 4mg  IV  Medications if other than standing orders:   NONE

## 2023-05-18 ENCOUNTER — Encounter
Admission: RE | Disposition: A | Discharge status: Home / Self Care | Payer: Self-pay | Source: Home / Self Care | Attending: Urology

## 2023-05-18 ENCOUNTER — Ambulatory Visit
Admission: RE | Admit: 2023-05-18 | Discharge: 2023-05-18 | Disposition: A | Discharge status: Home / Self Care | Payer: 59 | Attending: Urology | Admitting: Urology

## 2023-05-18 ENCOUNTER — Ambulatory Visit: Payer: 59

## 2023-05-18 ENCOUNTER — Encounter: Payer: Self-pay | Admitting: Urology

## 2023-05-18 ENCOUNTER — Other Ambulatory Visit: Payer: Self-pay

## 2023-05-18 DIAGNOSIS — N132 Hydronephrosis with renal and ureteral calculous obstruction: Secondary | ICD-10-CM | POA: Diagnosis present

## 2023-05-18 DIAGNOSIS — Z87442 Personal history of urinary calculi: Secondary | ICD-10-CM | POA: Diagnosis not present

## 2023-05-18 DIAGNOSIS — J439 Emphysema, unspecified: Secondary | ICD-10-CM | POA: Insufficient documentation

## 2023-05-18 DIAGNOSIS — I10 Essential (primary) hypertension: Secondary | ICD-10-CM | POA: Insufficient documentation

## 2023-05-18 DIAGNOSIS — N201 Calculus of ureter: Secondary | ICD-10-CM

## 2023-05-18 HISTORY — PX: EXTRACORPOREAL SHOCK WAVE LITHOTRIPSY: SHX1557

## 2023-05-18 LAB — MICROSCOPIC EXAMINATION

## 2023-05-18 LAB — URINALYSIS, COMPLETE
Bilirubin, UA: NEGATIVE
Glucose, UA: NEGATIVE
Ketones, UA: NEGATIVE
Leukocytes,UA: NEGATIVE
Nitrite, UA: NEGATIVE
Protein,UA: NEGATIVE
RBC, UA: NEGATIVE
Specific Gravity, UA: 1.02 (ref 1.005–1.030)
Urobilinogen, Ur: 0.2 mg/dL (ref 0.2–1.0)
pH, UA: 7 (ref 5.0–7.5)

## 2023-05-18 SURGERY — LITHOTRIPSY, ESWL
Anesthesia: Moderate Sedation | Laterality: Right

## 2023-05-18 MED ORDER — ONDANSETRON HCL 4 MG/2ML IJ SOLN
INTRAMUSCULAR | Status: AC
  Filled 2023-05-18: qty 2

## 2023-05-18 MED ORDER — ONDANSETRON HCL 4 MG/2ML IJ SOLN
4.0000 mg | Freq: Once | INTRAMUSCULAR | Status: AC
Start: 2023-05-18 — End: 2023-05-18
  Administered 2023-05-18: 4 mg via INTRAVENOUS

## 2023-05-18 MED ORDER — DIAZEPAM 5 MG PO TABS
ORAL_TABLET | ORAL | Status: AC
  Filled 2023-05-18: qty 2

## 2023-05-18 MED ORDER — TAMSULOSIN HCL 0.4 MG PO CAPS: 0.4000 mg | ORAL_CAPSULE | Freq: Every day | ORAL | 1 refills | Status: AC

## 2023-05-18 MED ORDER — DIAZEPAM 5 MG PO TABS
10.0000 mg | ORAL_TABLET | ORAL | Status: AC
  Administered 2023-05-18: 10 mg via ORAL

## 2023-05-18 MED ORDER — DIPHENHYDRAMINE HCL 25 MG PO CAPS
25.0000 mg | ORAL_CAPSULE | ORAL | Status: AC
  Administered 2023-05-18: 25 mg via ORAL

## 2023-05-18 MED ORDER — DIPHENHYDRAMINE HCL 25 MG PO CAPS
ORAL_CAPSULE | ORAL | Status: AC
  Filled 2023-05-18: qty 1

## 2023-05-18 MED ORDER — KETOROLAC TROMETHAMINE 10 MG PO TABS: 10.0000 mg | ORAL_TABLET | Freq: Four times a day (QID) | ORAL | 0 refills | Status: AC | PRN

## 2023-05-18 MED ORDER — SODIUM CHLORIDE 0.9 % IV SOLN: INTRAVENOUS | Status: DC

## 2023-05-18 MED ORDER — CEPHALEXIN 500 MG PO CAPS
500.0000 mg | ORAL_CAPSULE | Freq: Once | ORAL | Status: AC
Start: 2023-05-18 — End: 2023-05-18
  Administered 2023-05-18: 500 mg via ORAL

## 2023-05-18 MED ORDER — CEPHALEXIN 500 MG PO CAPS
ORAL_CAPSULE | ORAL | Status: AC
  Filled 2023-05-18: qty 1

## 2023-05-18 NOTE — Interval H&P Note (Signed)
UROLOGY H&P UPDATE  Agree with prior H&P dated 05/17/23, 6mm right distal ureteral stone.  Cardiac: RRR Lungs: CTA bilaterally  Laterality: right Procedure: shockwave lithotripsy  Urine: UA benign  Informed consent obtained, we specifically discussed the risks of bleeding/hematoma, infection, post-operative pain/obstructive fragments, need for additional procedures including URS/LL.  Sondra Come, MD 05/18/2023

## 2023-05-18 NOTE — Brief Op Note (Signed)
05/18/2023  12:44 PM  PATIENT:  Jack Davis  47 y.o. male  PRE-OPERATIVE DIAGNOSIS:  Right 6mm distal Ureteral Stone  POST-OPERATIVE DIAGNOSIS:  Same  PROCEDURE:  Procedure(s): EXTRACORPOREAL SHOCK WAVE LITHOTRIPSY (ESWL) (Right)  SURGEON:  Surgeons and Role:    * Sondra Come, MD - Primary  ANESTHESIA: Conscious Sedation  EBL:  None  Drains: None  Specimen: None  Findings:  Tolerated SWL very well, stone very dense but appeared to smudge  DISPO: Flomax, pain meds PRN, RTC 2 weeks KUB  Legrand Rams, MD 05/18/2023

## 2023-05-20 LAB — CULTURE, URINE COMPREHENSIVE

## 2023-05-22 ENCOUNTER — Encounter: Payer: Self-pay | Admitting: Urology

## 2023-05-22 ENCOUNTER — Encounter: Payer: Self-pay | Admitting: Family Medicine

## 2023-05-22 ENCOUNTER — Other Ambulatory Visit: Payer: Self-pay

## 2023-05-22 DIAGNOSIS — N201 Calculus of ureter: Secondary | ICD-10-CM

## 2023-05-23 ENCOUNTER — Other Ambulatory Visit: Payer: Self-pay

## 2023-05-23 MED ORDER — SULFAMETHOXAZOLE-TRIMETHOPRIM 800-160 MG PO TABS: 1.0000 | ORAL_TABLET | Freq: Two times a day (BID) | ORAL | 0 refills | Status: AC

## 2023-06-06 ENCOUNTER — Encounter: Payer: Self-pay | Admitting: Urology

## 2023-06-08 ENCOUNTER — Other Ambulatory Visit: Payer: Self-pay | Admitting: Urology

## 2023-06-08 DIAGNOSIS — N201 Calculus of ureter: Secondary | ICD-10-CM

## 2023-06-08 DIAGNOSIS — R3129 Other microscopic hematuria: Secondary | ICD-10-CM
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
## 2023-06-12 ENCOUNTER — Ambulatory Visit: Payer: 59 | Admitting: Urology

## 2023-06-12 DIAGNOSIS — R3129 Other microscopic hematuria: Secondary | ICD-10-CM

## 2023-06-12 DIAGNOSIS — N201 Calculus of ureter: Secondary | ICD-10-CM

## 2023-08-23 ENCOUNTER — Other Ambulatory Visit: Payer: Self-pay

## 2023-08-23 ENCOUNTER — Emergency Department
Admission: EM | Admit: 2023-08-23 | Discharge: 2023-08-23 | Disposition: A | Discharge status: Home / Self Care | Payer: Self-pay | Attending: Emergency Medicine | Admitting: Emergency Medicine

## 2023-08-23 DIAGNOSIS — R21 Rash and other nonspecific skin eruption: Secondary | ICD-10-CM | POA: Diagnosis present

## 2023-08-23 DIAGNOSIS — B029 Zoster without complications: Secondary | ICD-10-CM | POA: Diagnosis not present

## 2023-08-23 MED ORDER — VALACYCLOVIR HCL 1 G PO TABS: 1000.0000 mg | ORAL_TABLET | Freq: Three times a day (TID) | ORAL | 0 refills | Status: AC

## 2023-08-23 NOTE — ED Provider Notes (Signed)
 [facility] Provider Note    Event Date/Time   First MD Initiated Contact with Patient 08/23/23 1000     (approximate)   History   Rash   HPI  Jack Davis is a 48 year old male presenting to the emergency department for evaluation of rash.  A few weeks ago noticed some pain over his back.  Following this, he developed a rash that is now spread through his axillary area.  Over the past few days he has also noticed some lesions over his anterior chest along the same level.  No fevers.  Pain is controlled with NSAIDs.      Physical Exam   Triage Vital Signs: ED Triage Vitals  Encounter Vitals Group     BP 08/23/23 0941 120/78     Systolic BP Percentile --      Diastolic BP Percentile --      Pulse Rate 08/23/23 0941 68     Resp 08/23/23 0941 18     Temp 08/23/23 0941 97.8 F (36.6 C)     Temp Source 08/23/23 0941 Oral     SpO2 08/23/23 0941 97 %     Weight 08/23/23 0950 200 lb (90.7 kg)     Height 08/23/23 0950 5\' 9"  (1.753 m)     Head Circumference --      Peak Flow --      Pain Score 08/23/23 0950 2     Pain Loc --      Pain Education --      Exclude from Growth Chart --     Most recent vital signs: Vitals:   08/23/23 0941  BP: 120/78  Pulse: 68  Resp: 18  Temp: 97.8 F (36.6 C)  SpO2: 97%     General: Awake, interactive  CV:  Regular rate, good peripheral perfusion.  Resp:  Unlabored respirations, lungs clear to auscultation Abd:  Nondistended.  Neuro:  Symmetric facial movement, fluid speech Skin:  Scattered lesions most notably in the axilla, but extending into the back and anterior chest consistent with a dermatomal distribution, no open lesions or weeping   ED Results / Procedures / Treatments   Labs (all labs ordered are listed, but only abnormal results are displayed) Labs Reviewed - No data to display   EKG EKG independently reviewed interpreted by myself (ER attending) demonstrates:    RADIOLOGY Imaging  independently reviewed and interpreted by myself demonstrates:    PROCEDURES:  Critical Care performed: No  Procedures   MEDICATIONS ORDERED IN ED: Medications - No data to display   IMPRESSION / MDM / ASSESSMENT AND PLAN / ED COURSE  I reviewed the triage vital signs and the nursing notes.  Differential diagnosis includes, but is not limited to, shingles, dermatitis, no evidence of emergent rash  Patient's presentation is most consistent with acute, uncomplicated illness.  48 year old male presenting with progressive rash.  Exam is most consistent with shingles.  While he has had prolonged symptoms, he has had continued development of lesions, so we will discharge with antiviral treatment.  Pain is controlled with over-the-counter measures.  Strict return precautions provided.  Patient discharged in stable condition.     FINAL CLINICAL IMPRESSION(S) / ED DIAGNOSES   Final diagnoses:  Herpes zoster without complication     Rx / DC Orders   ED Discharge Orders          Ordered    valACYclovir (VALTREX) 1000 MG tablet  Every 8 hours  08/23/23 1141             Note:  This document was prepared using Dragon voice recognition software and may include unintentional dictation errors.   Trinna Post, MD 08/23/23 [phone]

## 2023-08-23 NOTE — Discharge Instructions (Addendum)
 You are seen for your rash.  I suspect this is related to shingles.  Use your antiviral medication as directed.  You can use over-the-counter Tylenol and NSAIDs to help with your pain.  Return to the ER for new or worsening symptoms.  Follow with your primary care doctor for further evaluation.

## 2023-08-23 NOTE — ED Triage Notes (Signed)
 Pt states rash to L axilla area for the past 4 weeks, pt unsure of past HX of chicken pox. Rash resembles shingles.

## 2023-09-05 ENCOUNTER — Ambulatory Visit: Payer: Self-pay | Admitting: Family Medicine
